# Patient Record
Sex: Male | Born: 1955 | Race: White | Hispanic: No | Marital: Married | Smoking: Never smoker
Health system: Southern US, Community
[De-identification: ages and names within clinical notes are randomized; demographics above are authoritative.]

---

## 2011-07-15 DIAGNOSIS — Z87898 Personal history of other specified conditions: Secondary | ICD-10-CM | POA: Insufficient documentation

## 2011-07-15 DIAGNOSIS — E781 Pure hyperglyceridemia: Secondary | ICD-10-CM | POA: Insufficient documentation

## 2011-07-15 DIAGNOSIS — Z8669 Personal history of other diseases of the nervous system and sense organs: Secondary | ICD-10-CM | POA: Insufficient documentation

## 2011-07-15 DIAGNOSIS — E7800 Pure hypercholesterolemia, unspecified: Secondary | ICD-10-CM | POA: Insufficient documentation

## 2011-07-15 DIAGNOSIS — M109 Gout, unspecified: Secondary | ICD-10-CM | POA: Insufficient documentation

## 2014-06-26 DIAGNOSIS — E669 Obesity, unspecified: Secondary | ICD-10-CM | POA: Insufficient documentation

## 2015-10-16 ENCOUNTER — Ambulatory Visit: Payer: BLUE CROSS/BLUE SHIELD | Admitting: Podiatry

## 2015-10-21 ENCOUNTER — Encounter: Payer: Self-pay | Admitting: Podiatry

## 2015-10-21 ENCOUNTER — Ambulatory Visit (INDEPENDENT_AMBULATORY_CARE_PROVIDER_SITE_OTHER): Payer: BLUE CROSS/BLUE SHIELD | Admitting: Podiatry

## 2015-10-21 VITALS — BP 149/97 | HR 59 | Resp 16

## 2015-10-21 DIAGNOSIS — Q828 Other specified congenital malformations of skin: Secondary | ICD-10-CM | POA: Diagnosis not present
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
## 2016-01-17 DIAGNOSIS — R739 Hyperglycemia, unspecified: Secondary | ICD-10-CM | POA: Insufficient documentation

## 2016-04-14 ENCOUNTER — Encounter: Payer: Self-pay | Admitting: Podiatry

## 2016-04-14 ENCOUNTER — Ambulatory Visit (INDEPENDENT_AMBULATORY_CARE_PROVIDER_SITE_OTHER): Payer: BLUE CROSS/BLUE SHIELD | Admitting: Podiatry

## 2016-04-14 VITALS — BP 123/86 | HR 69 | Resp 16 | Wt 235.0 lb

## 2016-04-14 DIAGNOSIS — M79671 Pain in right foot: Secondary | ICD-10-CM

## 2016-04-14 DIAGNOSIS — Q828 Other specified congenital malformations of skin: Secondary | ICD-10-CM

## 2016-04-14 DIAGNOSIS — M79672 Pain in left foot: Secondary | ICD-10-CM

## 2016-04-14 DIAGNOSIS — L84 Corns and callosities: Secondary | ICD-10-CM

## 2016-04-14 DIAGNOSIS — L851 Acquired keratosis [keratoderma] palmaris et plantaris: Secondary | ICD-10-CM

## 2016-04-14 NOTE — Progress Notes (Signed)
Subjective: Patient presents to the office today for chief complaint of painful callus lesions of the feet. Patient states that the pain is ongoing and is affecting their ability to ambulate without pain. Patient presents today for further treatment and evaluation.  Objective:  Physical Exam General: Alert and oriented x3 in no acute distress  Dermatology: Hyperkeratotic lesion present on the lateral aspect of the midfoot bilaterally, multiple porokeratoses. Pain on palpation with a central nucleated core noted.  Skin is warm, dry and supple bilateral lower extremities. Negative for open lesions or macerations.  Vascular: Palpable pedal pulses bilaterally. No edema or erythema noted. Capillary refill within normal limits.  Neurological: Epicritic and protective threshold grossly intact bilaterally.   Musculoskeletal Exam: Pain on palpation at the keratotic lesion noted. Range of motion within normal limits bilateral. Muscle strength 5/5 in all groups bilateral.  Assessment: #1 porokeratosis bilateral feet #2 pain in bilateral feet #3 callus lesions bilateral feet   Plan of Care:  #1 Patient evaluated #2 Excisional debridement of  keratoic lesion using a chisel blade was performed without incident.  #3 Treated area(s) with Salinocaine and dressed with light dressing. #4 Patient is to return to the clinic PRN.   Felecia ShellingBrent M. Evans, DPM [facility]

## 2016-05-26 ENCOUNTER — Telehealth: Payer: Self-pay | Admitting: Podiatry

## 2016-05-26 NOTE — Telephone Encounter (Signed)
Pt called stating that his insurance denied paying his bill due to they didn't know that code that was sent. He also states that he has had this done b4 and they have paid it

## 2017-08-24 ENCOUNTER — Ambulatory Visit: Payer: BLUE CROSS/BLUE SHIELD | Admitting: Podiatry

## 2017-08-27 ENCOUNTER — Ambulatory Visit (INDEPENDENT_AMBULATORY_CARE_PROVIDER_SITE_OTHER): Payer: Managed Care, Other (non HMO) | Admitting: Podiatry

## 2017-08-27 DIAGNOSIS — L851 Acquired keratosis [keratoderma] palmaris et plantaris: Secondary | ICD-10-CM

## 2017-08-27 DIAGNOSIS — Q828 Other specified congenital malformations of skin: Secondary | ICD-10-CM | POA: Diagnosis not present

## 2017-08-30 NOTE — Progress Notes (Signed)
   Subjective: 62 year old male presents to the office today for chief complaint of painful callus lesions of the feet bilaterally that have been present for greater than one year. Patient states that the pain is ongoing and is affecting their ability to ambulate without pain. Patient presents today for further treatment and evaluation.   No past medical history on file.   Objective:  Physical Exam General: Alert and oriented x3 in no acute distress  Dermatology: Hyperkeratotic lesions present on the bilateral feet x 5. Pain on palpation with a central nucleated core noted.  Skin is warm, dry and supple bilateral lower extremities. Negative for open lesions or macerations.  Vascular: Palpable pedal pulses bilaterally. No edema or erythema noted. Capillary refill within normal limits.  Neurological: Epicritic and protective threshold grossly intact bilaterally.   Musculoskeletal Exam: Pain on palpation at the keratotic lesion noted. Range of motion within normal limits bilateral. Muscle strength 5/5 in all groups bilateral.  Assessment: #1 Porokeratosis bilateral feet x 5   Plan of Care:  #1 Patient evaluated #2 Excisional debridement of keratoic lesions using a chisel blade was performed without incident.  #3 Treated areas with Salinocaine and dressed with light dressing. #4 recommended urea 40% cream. #5 Continue getting pedicures for routine maintenance.  #6 Return to clinic as needed.     Felecia ShellingBrent M. Avneet Ashmore, DPM [facility]  Dr. Felecia ShellingBrent M. Tylik Treese, DPM    [address]                                        [place], [place] [address]                Office [phone]  Fax [phone]

## 2017-11-12 ENCOUNTER — Telehealth: Payer: Self-pay | Admitting: *Deleted

## 2017-11-12 NOTE — Telephone Encounter (Signed)
Pt's wife, Toniann Fail states they need a prescription written for [id number] with the date of 08/27/2017. I told Toniann Fail there was no one in the [place] office, but I would send the nurse a message and they could pick it up. Toniann Fail asked if we could email to pt at [email or web address].

## 2017-11-16 MED ORDER — NONFORMULARY OR COMPOUNDED ITEM: 11 refills | Status: AC

## 2017-11-16 NOTE — Addendum Note (Signed)
Addended by: Alphia Kava D on: 11/16/2017 01:41 PM   Modules accepted: Orders

## 2017-11-16 NOTE — Telephone Encounter (Signed)
Could you please e-mail patient the prescription for [id number].

## 2017-11-16 NOTE — Telephone Encounter (Signed)
Pt's wife, Toniann Fail states she has not received the prescription discussed on Friday from [place]. I told scheduler to tell Toniann Fail I would email to her.

## 2021-03-06 ENCOUNTER — Other Ambulatory Visit: Payer: Self-pay | Admitting: Family Medicine

## 2021-03-06 ENCOUNTER — Ambulatory Visit
Admission: RE | Admit: 2021-03-06 | Discharge: 2021-03-06 | Disposition: A | Discharge status: Home / Self Care | Payer: Managed Care, Other (non HMO) | Source: Ambulatory Visit | Attending: Family Medicine | Admitting: Family Medicine

## 2021-03-06 ENCOUNTER — Other Ambulatory Visit: Payer: Self-pay

## 2021-03-06 ENCOUNTER — Ambulatory Visit
Admission: RE | Admit: 2021-03-06 | Discharge: 2021-03-06 | Disposition: A | Discharge status: Home / Self Care | Payer: Managed Care, Other (non HMO) | Attending: Family Medicine | Admitting: Family Medicine

## 2021-03-06 DIAGNOSIS — R202 Paresthesia of skin: Secondary | ICD-10-CM

## 2021-03-06 DIAGNOSIS — R200 Anesthesia of skin: Secondary | ICD-10-CM | POA: Diagnosis not present

## 2022-09-29 IMAGING — CR DG CERVICAL SPINE COMPLETE 4+V
6 of 7 series · 8 of 9 positions shown · non-contrast
Comparison: None.

CLINICAL DATA: Numbness and tingling

EXAM:
CERVICAL SPINE - COMPLETE 4+ VIEW

[c-spine lat]
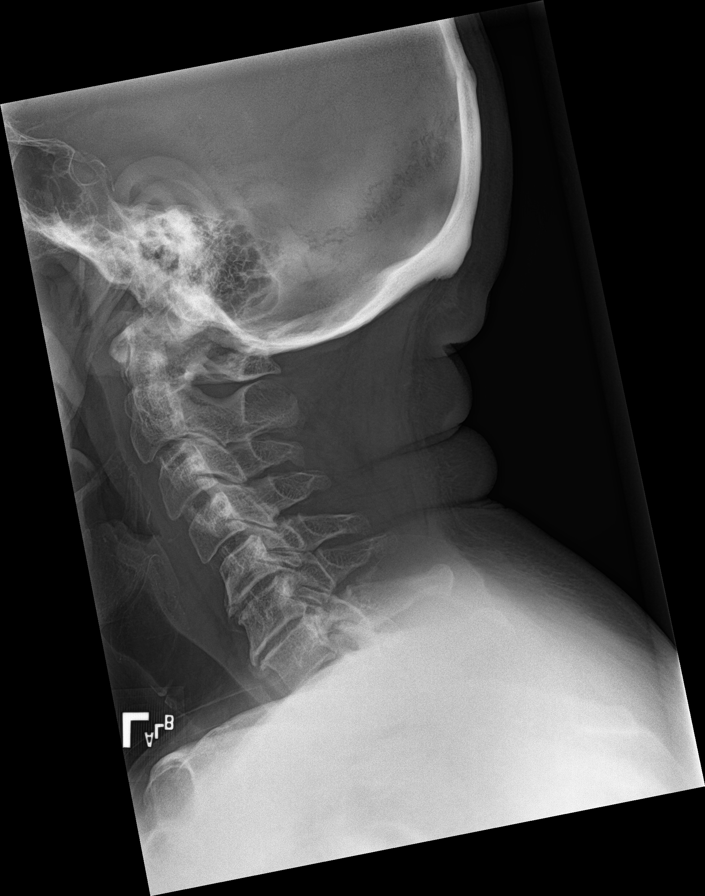

[Series 2: c-spine obl · 0.14mm/px · 2 of 2 slices shown (1 of 2)]
[im 1/2]
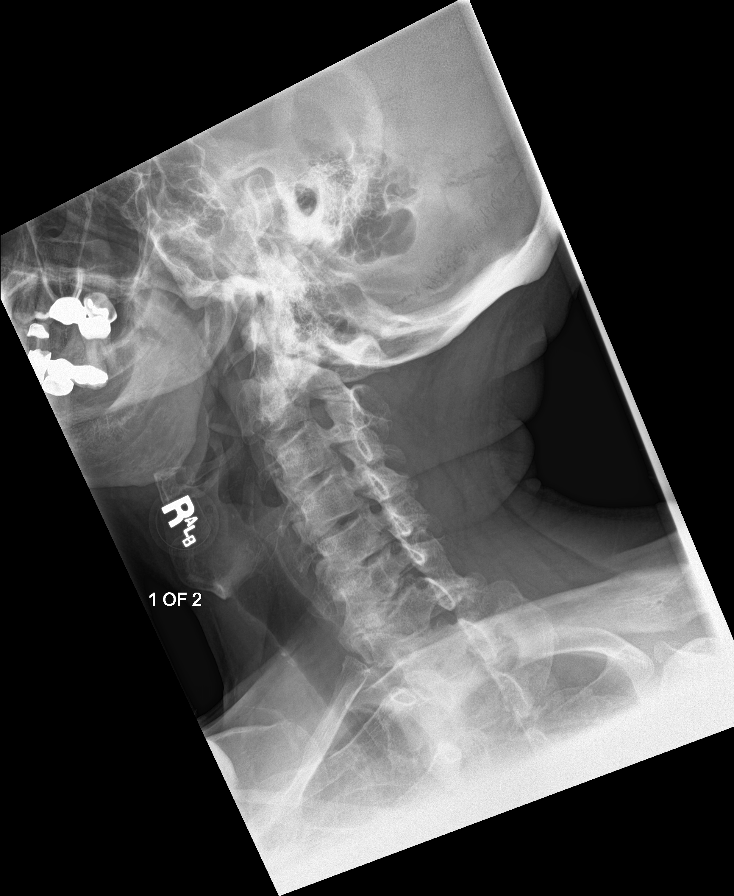
[im 2/2]
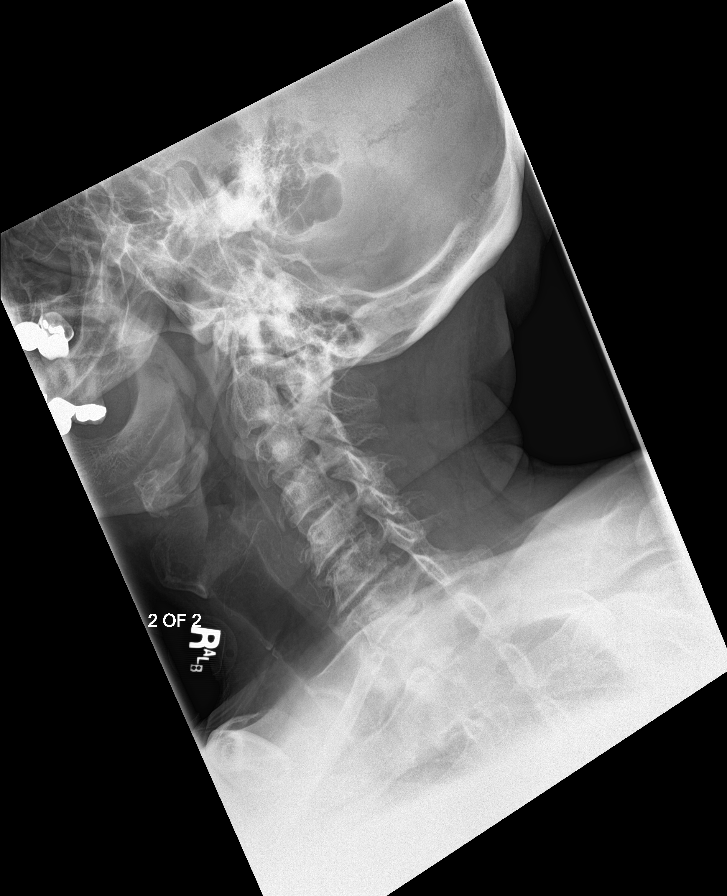

[Series 3: c-spine obl · 0.14mm/px · 2 of 2 slices shown (2 of 2)]
[im 1/2]
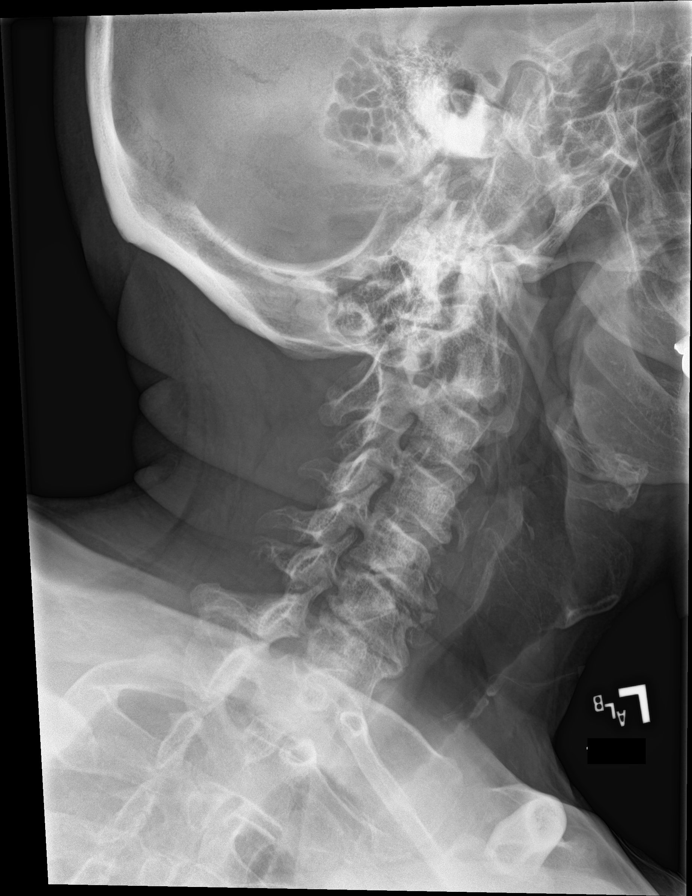
[im 2/2]
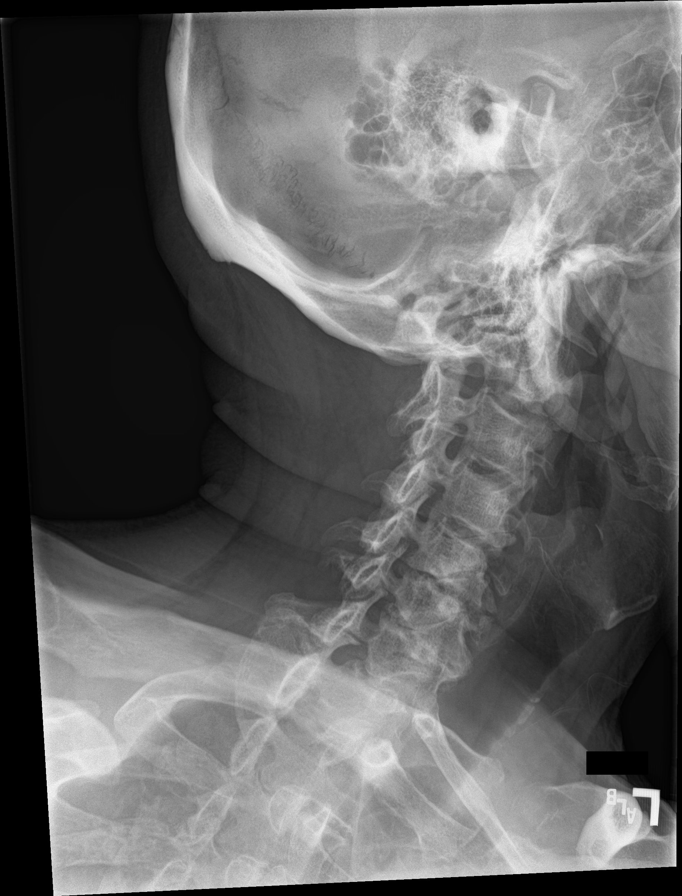

[c-spine ap]
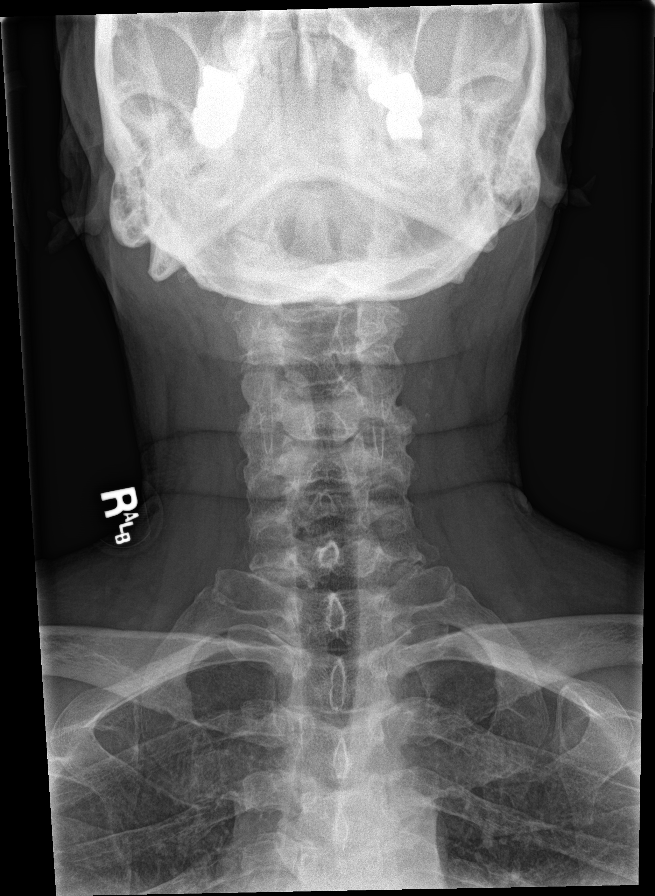

[c-spine open mouth]
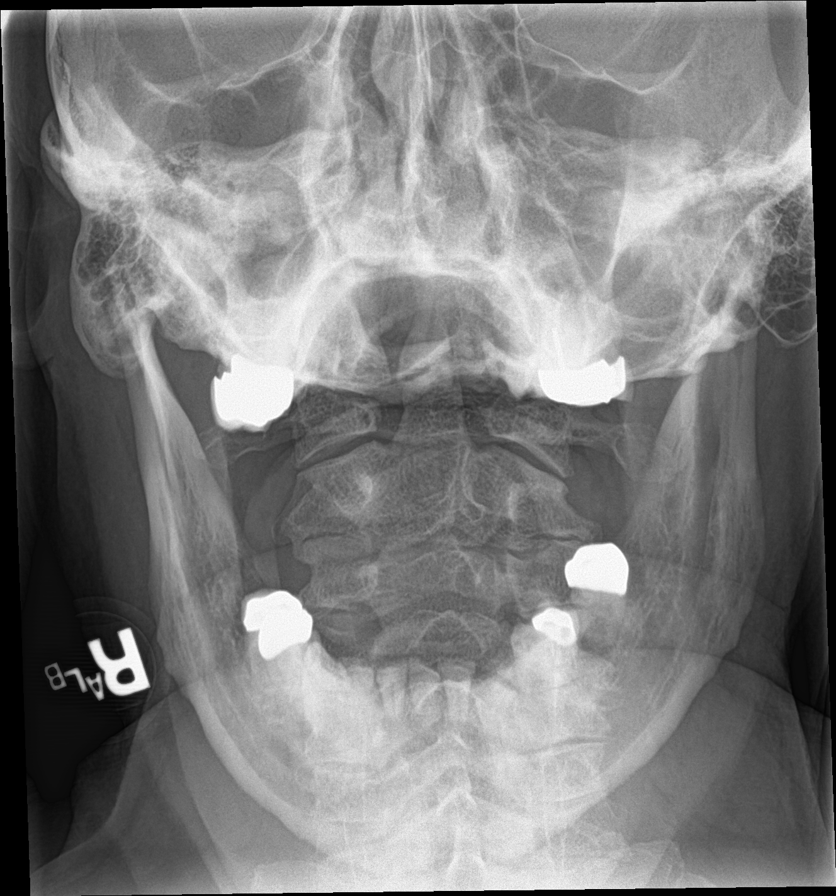

[[person_name]]
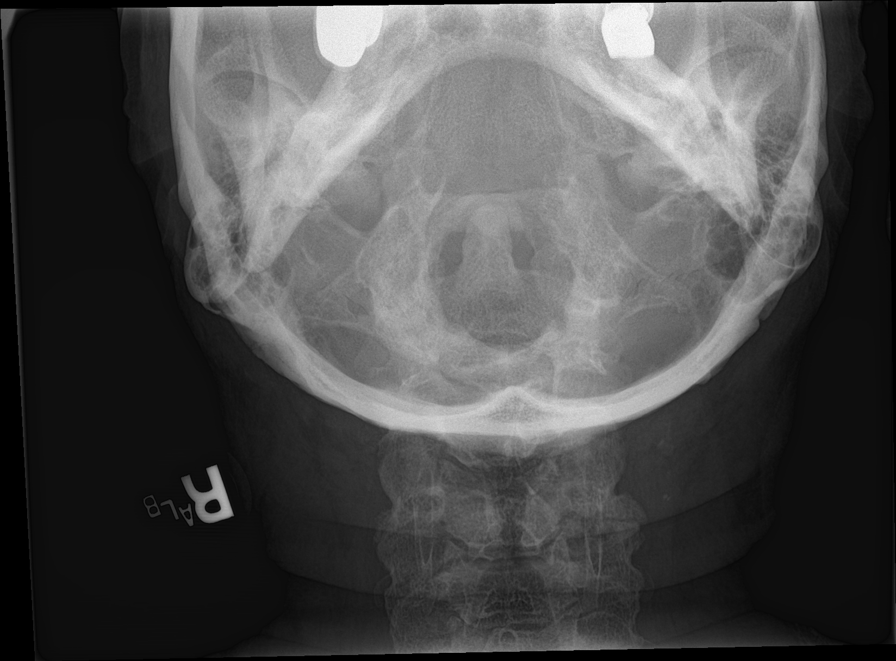

[8 of 9 positions shown; findings below may reference images not displayed]

FINDINGS: Straightening of the cervical spine. Vertebral body heights are
maintained. Moderate disc space narrowing and degenerative change
C5-C6 and C6-C7. Diffuse bilateral foraminal narrowing C3 through
C7. Dens and lateral masses are within normal limits.
IMPRESSION: Straightening of the cervical spine with moderate degenerative
changes at C5-C6 and C6-C7.
